# Patient Record
Sex: Male | Born: 1985 | Race: White | Hispanic: No | Marital: Married | Smoking: Former smoker
Health system: Southern US, Community
[De-identification: ages and names within clinical notes are randomized; demographics above are authoritative.]

## PROBLEM LIST (undated history)

## (undated) DIAGNOSIS — E785 Hyperlipidemia, unspecified: Secondary | ICD-10-CM

## (undated) DIAGNOSIS — I10 Essential (primary) hypertension: Secondary | ICD-10-CM

## (undated) DIAGNOSIS — F419 Anxiety disorder, unspecified: Secondary | ICD-10-CM

## (undated) DIAGNOSIS — R519 Headache, unspecified: Secondary | ICD-10-CM

## (undated) DIAGNOSIS — K219 Gastro-esophageal reflux disease without esophagitis: Secondary | ICD-10-CM

## (undated) DIAGNOSIS — F32A Depression, unspecified: Secondary | ICD-10-CM

## (undated) DIAGNOSIS — G4730 Sleep apnea, unspecified: Secondary | ICD-10-CM

## (undated) HISTORY — PX: WISDOM TOOTH EXTRACTION: SHX21

---

## 2009-05-17 ENCOUNTER — Emergency Department: Payer: Self-pay | Admitting: Emergency Medicine

## 2009-05-20 ENCOUNTER — Emergency Department: Payer: Self-pay | Admitting: Emergency Medicine

## 2009-05-24 ENCOUNTER — Emergency Department: Payer: Self-pay | Admitting: Emergency Medicine

## 2009-05-31 ENCOUNTER — Emergency Department: Payer: Self-pay | Admitting: Emergency Medicine

## 2009-06-14 ENCOUNTER — Emergency Department: Payer: Self-pay | Admitting: Emergency Medicine

## 2014-03-21 ENCOUNTER — Emergency Department: Payer: Self-pay | Admitting: Emergency Medicine

## 2014-03-21 LAB — COMPREHENSIVE METABOLIC PANEL
ALBUMIN: 4.3 g/dL (ref 3.4–5.0)
Alkaline Phosphatase: 75 U/L
Anion Gap: 5 — ABNORMAL LOW (ref 7–16)
BUN: 11 mg/dL (ref 7–18)
Bilirubin,Total: 0.4 mg/dL (ref 0.2–1.0)
Calcium, Total: 9 mg/dL (ref 8.5–10.1)
Chloride: 106 mmol/L (ref 98–107)
Co2: 28 mmol/L (ref 21–32)
Creatinine: 0.99 mg/dL (ref 0.60–1.30)
Glucose: 99 mg/dL (ref 65–99)
OSMOLALITY: 277 (ref 275–301)
POTASSIUM: 3.8 mmol/L (ref 3.5–5.1)
SGOT(AST): 29 U/L (ref 15–37)
SGPT (ALT): 56 U/L (ref 12–78)
SODIUM: 139 mmol/L (ref 136–145)
TOTAL PROTEIN: 8.2 g/dL (ref 6.4–8.2)

## 2014-03-21 LAB — CBC WITH DIFFERENTIAL/PLATELET
BASOS PCT: 0.5 %
Basophil #: 0 10*3/uL (ref 0.0–0.1)
Eosinophil #: 0.1 10*3/uL (ref 0.0–0.7)
Eosinophil %: 1.4 %
HCT: 47.7 % (ref 40.0–52.0)
HGB: 15.9 g/dL (ref 13.0–18.0)
LYMPHS PCT: 36.9 %
Lymphocyte #: 3 10*3/uL (ref 1.0–3.6)
MCH: 30.8 pg (ref 26.0–34.0)
MCHC: 33.4 g/dL (ref 32.0–36.0)
MCV: 92 fL (ref 80–100)
MONOS PCT: 7.1 %
Monocyte #: 0.6 x10 3/mm (ref 0.2–1.0)
NEUTROS PCT: 54.1 %
Neutrophil #: 4.4 10*3/uL (ref 1.4–6.5)
PLATELETS: 212 10*3/uL (ref 150–440)
RBC: 5.16 10*6/uL (ref 4.40–5.90)
RDW: 12.3 % (ref 11.5–14.5)
WBC: 8.1 10*3/uL (ref 3.8–10.6)

## 2015-08-23 ENCOUNTER — Other Ambulatory Visit: Payer: Self-pay | Admitting: Internal Medicine

## 2015-08-23 DIAGNOSIS — M5442 Lumbago with sciatica, left side: Secondary | ICD-10-CM

## 2015-09-01 ENCOUNTER — Other Ambulatory Visit: Payer: Self-pay | Admitting: Internal Medicine

## 2015-09-01 ENCOUNTER — Ambulatory Visit
Admission: RE | Admit: 2015-09-01 | Discharge: 2015-09-01 | Disposition: A | Discharge status: Home / Self Care | Payer: 59 | Source: Ambulatory Visit | Attending: Internal Medicine | Admitting: Internal Medicine

## 2015-09-01 DIAGNOSIS — M5136 Other intervertebral disc degeneration, lumbar region: Secondary | ICD-10-CM | POA: Diagnosis not present

## 2015-09-01 DIAGNOSIS — Z9889 Other specified postprocedural states: Secondary | ICD-10-CM

## 2015-09-01 DIAGNOSIS — M5442 Lumbago with sciatica, left side: Secondary | ICD-10-CM | POA: Insufficient documentation

## 2015-09-01 DIAGNOSIS — Z1389 Encounter for screening for other disorder: Secondary | ICD-10-CM | POA: Diagnosis present

## 2015-09-01 DIAGNOSIS — M5127 Other intervertebral disc displacement, lumbosacral region: Secondary | ICD-10-CM | POA: Insufficient documentation

## 2015-09-03 ENCOUNTER — Ambulatory Visit: Payer: Self-pay

## 2016-05-05 ENCOUNTER — Ambulatory Visit
Admission: RE | Admit: 2016-05-05 | Discharge: 2016-05-05 | Disposition: A | Discharge status: Home / Self Care | Payer: 59 | Source: Ambulatory Visit | Attending: Internal Medicine | Admitting: Internal Medicine

## 2016-05-05 ENCOUNTER — Other Ambulatory Visit: Payer: Self-pay | Admitting: Internal Medicine

## 2016-05-05 DIAGNOSIS — M545 Low back pain: Secondary | ICD-10-CM | POA: Diagnosis not present

## 2016-05-05 DIAGNOSIS — M25562 Pain in left knee: Secondary | ICD-10-CM | POA: Diagnosis not present

## 2016-05-25 DIAGNOSIS — M25562 Pain in left knee: Secondary | ICD-10-CM | POA: Diagnosis not present

## 2016-05-25 DIAGNOSIS — M7052 Other bursitis of knee, left knee: Secondary | ICD-10-CM | POA: Diagnosis not present

## 2016-05-30 ENCOUNTER — Other Ambulatory Visit: Payer: Self-pay | Admitting: Orthopedic Surgery

## 2016-05-30 DIAGNOSIS — M25562 Pain in left knee: Secondary | ICD-10-CM

## 2016-06-12 ENCOUNTER — Ambulatory Visit
Admission: RE | Admit: 2016-06-12 | Discharge: 2016-06-12 | Disposition: A | Discharge status: Home / Self Care | Payer: 59 | Source: Ambulatory Visit | Attending: Orthopedic Surgery | Admitting: Orthopedic Surgery

## 2016-06-12 DIAGNOSIS — M2242 Chondromalacia patellae, left knee: Secondary | ICD-10-CM | POA: Insufficient documentation

## 2016-06-12 DIAGNOSIS — M13862 Other specified arthritis, left knee: Secondary | ICD-10-CM | POA: Insufficient documentation

## 2016-06-12 DIAGNOSIS — M25562 Pain in left knee: Secondary | ICD-10-CM | POA: Diagnosis not present

## 2016-10-22 DIAGNOSIS — Z20818 Contact with and (suspected) exposure to other bacterial communicable diseases: Secondary | ICD-10-CM | POA: Diagnosis not present

## 2016-10-22 DIAGNOSIS — J039 Acute tonsillitis, unspecified: Secondary | ICD-10-CM | POA: Diagnosis not present

## 2016-10-26 ENCOUNTER — Emergency Department
Admission: EM | Admit: 2016-10-26 | Discharge: 2016-10-26 | Disposition: A | Discharge status: Home / Self Care | Payer: 59 | Attending: Emergency Medicine | Admitting: Emergency Medicine

## 2016-10-26 ENCOUNTER — Encounter: Payer: Self-pay | Admitting: *Deleted

## 2016-10-26 DIAGNOSIS — Z23 Encounter for immunization: Secondary | ICD-10-CM | POA: Diagnosis not present

## 2016-10-26 DIAGNOSIS — W268XXA Contact with other sharp object(s), not elsewhere classified, initial encounter: Secondary | ICD-10-CM | POA: Insufficient documentation

## 2016-10-26 DIAGNOSIS — F17200 Nicotine dependence, unspecified, uncomplicated: Secondary | ICD-10-CM | POA: Diagnosis not present

## 2016-10-26 DIAGNOSIS — Y929 Unspecified place or not applicable: Secondary | ICD-10-CM | POA: Diagnosis not present

## 2016-10-26 DIAGNOSIS — Y939 Activity, unspecified: Secondary | ICD-10-CM | POA: Insufficient documentation

## 2016-10-26 DIAGNOSIS — Y999 Unspecified external cause status: Secondary | ICD-10-CM | POA: Insufficient documentation

## 2016-10-26 DIAGNOSIS — S99921A Unspecified injury of right foot, initial encounter: Secondary | ICD-10-CM | POA: Diagnosis present

## 2016-10-26 DIAGNOSIS — S91311A Laceration without foreign body, right foot, initial encounter: Secondary | ICD-10-CM | POA: Insufficient documentation

## 2016-10-26 MED ORDER — SULFAMETHOXAZOLE-TRIMETHOPRIM 800-160 MG PO TABS: 1.0000 | ORAL_TABLET | Freq: Two times a day (BID) | ORAL | 0 refills | Status: DC

## 2016-10-26 MED ORDER — OXYCODONE-ACETAMINOPHEN 7.5-325 MG PO TABS: 1.0000 | ORAL_TABLET | ORAL | 0 refills | Status: DC | PRN

## 2016-10-26 MED ORDER — TETANUS-DIPHTH-ACELL PERTUSSIS 5-2.5-18.5 LF-MCG/0.5 IM SUSP
0.5000 mL | Freq: Once | INTRAMUSCULAR | Status: AC
  Administered 2016-10-26: 0.5 mL via INTRAMUSCULAR

## 2016-10-26 MED ORDER — TETANUS-DIPHTH-ACELL PERTUSSIS 5-2.5-18.5 LF-MCG/0.5 IM SUSP
INTRAMUSCULAR | Status: AC
  Administered 2016-10-26: 0.5 mL via INTRAMUSCULAR
  Filled 2016-10-26: qty 0.5

## 2016-10-26 NOTE — ED Triage Notes (Signed)
Pt stepped on a razor blade and sustained a laceration on the bottom of his right foot

## 2016-10-26 NOTE — ED Provider Notes (Signed)
[facility] Emergency Department Provider Note   ____________________________________________   None    (approximate)  I have reviewed the triage vital signs and the nursing notes.   HISTORY  Chief Complaint Extremity Laceration    HPI Marguerite OleaJimmy L Kauzlarich is a 30 y.o. male patient complain of pain and bleeding plantar aspect the right foot secondary to stepping on a razor blade.Patient state unable to stop the bleeding with pressure. Patient denies loss of sensation or loss of function of the foot. Incident occurred prior to arrival. Patient rates his pain as a 5/10. No other palliative measures for this complaint. Patient tetanus shot is not up-to-date.   History reviewed. No pertinent past medical history.  There are no active problems to display for this patient.   History reviewed. No pertinent surgical history.  Prior to Admission medications   Medication Sig Start Date End Date Taking? Authorizing Provider  oxyCODONE-acetaminophen (PERCOCET) 7.5-325 MG tablet Take 1 tablet by mouth every 4 (four) hours as needed for severe pain. 10/26/16   Joni Reiningonald K Kelise Kuch, PA-C  sulfamethoxazole-trimethoprim (BACTRIM DS,SEPTRA DS) 800-160 MG tablet Take 1 tablet by mouth 2 (two) times daily. 10/26/16   Joni Reiningonald K Barbara Keng, PA-C    Allergies Patient has no known allergies.  No family history on file.  Social History Social History  Substance Use Topics  . Smoking status: Current Every Day Smoker  . Smokeless tobacco: Current User  . Alcohol use Yes    Review of Systems Constitutional: No fever/chills Eyes: No visual changes. ENT: No sore throat. Cardiovascular: Denies chest pain. Respiratory: Denies shortness of breath. Gastrointestinal: No abdominal pain.  No nausea, no vomiting.  No diarrhea.  No constipation. Genitourinary: Negative for dysuria. Musculoskeletal: Negative for back pain. Skin: Negative for rash. Laceration palmar aspect the right  foot. Neurological: Negative for headaches, focal weakness or numbness.    ____________________________________________   PHYSICAL EXAM:  VITAL SIGNS: ED Triage Vitals  Enc Vitals Group     BP 10/26/16 0719 133/87     Pulse Rate 10/26/16 0719 88     Resp 10/26/16 0719 18     Temp 10/26/16 0719 97.6 F (36.4 C)     Temp Source 10/26/16 0719 Oral     SpO2 10/26/16 0719 97 %     Weight 10/26/16 0716 208 lb (94.3 kg)     Height 10/26/16 0716 5\' 10"  (1.778 m)     Head Circumference --      Peak Flow --      Pain Score 10/26/16 0716 5     Pain Loc --      Pain Edu? --      Excl. in [place]? --     Constitutional: Alert and oriented. Well appearing and in no acute distress. Eyes: Conjunctivae are normal. PERRL. EOMI. Head: Atraumatic. Nose: No congestion/rhinnorhea. Mouth/Throat: Mucous membranes are moist.  Oropharynx non-erythematous. Neck: No stridor.  No cervical spine tenderness to palpation. Hematological/Lymphatic/Immunilogical: No cervical lymphadenopathy. Cardiovascular: Normal rate, regular rhythm. Grossly normal heart sounds.  Good peripheral circulation. Respiratory: Normal respiratory effort.  No retractions. Lungs CTAB. Gastrointestinal: Soft and nontender. No distention. No abdominal bruits. No CVA tenderness. Musculoskeletal: No lower extremity tenderness nor edema.  No joint effusions. Neurologic:  Normal speech and language. No gross focal neurologic deficits are appreciated. No gait instability. Skin: 2 cm laceration plantar aspect the right foot.  Psychiatric: Mood and affect are normal. Speech and behavior are normal.  ____________________________________________   LABS (all  labs ordered are listed, but only abnormal results are displayed)  Labs Reviewed - No data to  display ____________________________________________  EKG   ____________________________________________  RADIOLOGY   ____________________________________________   PROCEDURES  Procedure(s) performed: LACERATION REPAIR Performed by: Joni Reiningonald K Tymber Stallings Authorized by: Joni Reiningonald K Taunja Brickner Consent: Verbal consent obtained. Risks and benefits: risks, benefits and alternatives were discussed Consent given by: patient Patient identity confirmed: provided demographic data Prepped and Draped in normal sterile fashion Wound explored  Laceration Location: Plantar aspect right foot  Laceration Length: 2cm  No Foreign Bodies seen or palpated  Anesthesia: local infiltration  Local anesthetic: lidocaine 1% with epinephrine  Anesthetic total: 5 ml  Irrigation method: syringe Amount of cleaning: standard  Skin closure: 3-0 nylon Number of sutures: 8   Technique: Interrupted Patient tolerance: Patient tolerated the procedure well with no immediate complications.   Procedures  Critical Care performed: No  ____________________________________________   INITIAL IMPRESSION / ASSESSMENT AND PLAN / ED COURSE  Pertinent labs & imaging results that were available during my care of the patient were reviewed by me and considered in my medical decision making (see chart for details).  Laceration plantar aspect right foot. Patient given discharge care instructions. Patient advised return back 10 days for suture removal only had suture removal by family care doctor. Patient given prescription for Bactrim DS and Percocets.  Clinical Course    Patient given tetanus shot prior to departure.  ____________________________________________   FINAL CLINICAL IMPRESSION(S) / ED DIAGNOSES  Final diagnoses:  Laceration of right foot, initial encounter      NEW MEDICATIONS STARTED DURING THIS VISIT:  New Prescriptions   OXYCODONE-ACETAMINOPHEN (PERCOCET) 7.5-325 MG TABLET    Take 1 tablet  by mouth every 4 (four) hours as needed for severe pain.   SULFAMETHOXAZOLE-TRIMETHOPRIM (BACTRIM DS,SEPTRA DS) 800-160 MG TABLET    Take 1 tablet by mouth 2 (two) times daily.     Note:  This document was prepared using Dragon voice recognition software and may include unintentional dictation errors.    Joni Reiningonald K Andreya Lacks, PA-C 10/26/16 [id number]    Jeanmarie PlantJames A McShane, MD 10/28/16 [phone]

## 2016-10-26 NOTE — ED Notes (Signed)
See triage note   States he stepped on a razor blade  Laceration noted to right foot/heel

## 2016-10-26 NOTE — Discharge Instructions (Signed)
Ambulate with crutches for 2-3 days. °

## 2016-11-15 DIAGNOSIS — G43709 Chronic migraine without aura, not intractable, without status migrainosus: Secondary | ICD-10-CM | POA: Diagnosis not present

## 2016-11-15 DIAGNOSIS — I10 Essential (primary) hypertension: Secondary | ICD-10-CM | POA: Diagnosis not present

## 2016-11-15 DIAGNOSIS — F418 Other specified anxiety disorders: Secondary | ICD-10-CM | POA: Diagnosis not present

## 2016-12-22 DIAGNOSIS — F418 Other specified anxiety disorders: Secondary | ICD-10-CM | POA: Diagnosis not present

## 2016-12-22 DIAGNOSIS — G43709 Chronic migraine without aura, not intractable, without status migrainosus: Secondary | ICD-10-CM | POA: Diagnosis not present

## 2016-12-22 DIAGNOSIS — I10 Essential (primary) hypertension: Secondary | ICD-10-CM | POA: Diagnosis not present

## 2017-03-16 DIAGNOSIS — F419 Anxiety disorder, unspecified: Secondary | ICD-10-CM | POA: Diagnosis not present

## 2017-03-16 DIAGNOSIS — Z13220 Encounter for screening for lipoid disorders: Secondary | ICD-10-CM | POA: Diagnosis not present

## 2017-03-16 DIAGNOSIS — Z79899 Other long term (current) drug therapy: Secondary | ICD-10-CM | POA: Diagnosis not present

## 2017-03-16 DIAGNOSIS — Z1329 Encounter for screening for other suspected endocrine disorder: Secondary | ICD-10-CM | POA: Diagnosis not present

## 2017-03-16 DIAGNOSIS — I10 Essential (primary) hypertension: Secondary | ICD-10-CM | POA: Diagnosis not present

## 2017-03-16 DIAGNOSIS — G43709 Chronic migraine without aura, not intractable, without status migrainosus: Secondary | ICD-10-CM | POA: Diagnosis not present

## 2017-04-10 ENCOUNTER — Emergency Department
Admission: EM | Admit: 2017-04-10 | Discharge: 2017-04-10 | Disposition: A | Discharge status: Home / Self Care | Payer: 59 | Attending: Emergency Medicine | Admitting: Emergency Medicine

## 2017-04-10 DIAGNOSIS — F17200 Nicotine dependence, unspecified, uncomplicated: Secondary | ICD-10-CM | POA: Insufficient documentation

## 2017-04-10 DIAGNOSIS — Z79899 Other long term (current) drug therapy: Secondary | ICD-10-CM | POA: Insufficient documentation

## 2017-04-10 DIAGNOSIS — S00531A Contusion of lip, initial encounter: Secondary | ICD-10-CM

## 2017-04-10 DIAGNOSIS — Y999 Unspecified external cause status: Secondary | ICD-10-CM | POA: Insufficient documentation

## 2017-04-10 DIAGNOSIS — Y9389 Activity, other specified: Secondary | ICD-10-CM | POA: Diagnosis not present

## 2017-04-10 DIAGNOSIS — W228XXA Striking against or struck by other objects, initial encounter: Secondary | ICD-10-CM | POA: Insufficient documentation

## 2017-04-10 DIAGNOSIS — Y929 Unspecified place or not applicable: Secondary | ICD-10-CM | POA: Insufficient documentation

## 2017-04-10 DIAGNOSIS — S01511A Laceration without foreign body of lip, initial encounter: Secondary | ICD-10-CM | POA: Diagnosis not present

## 2017-04-10 DIAGNOSIS — K0381 Cracked tooth: Secondary | ICD-10-CM | POA: Insufficient documentation

## 2017-04-10 MED ORDER — LIDOCAINE-EPINEPHRINE 2 %-1:100000 IJ SOLN
INTRAMUSCULAR | Status: AC
  Filled 2017-04-10: qty 1.7

## 2017-04-10 MED ORDER — LIDOCAINE-EPINEPHRINE-TETRACAINE (LET) SOLUTION
3.0000 mL | Freq: Once | NASAL | Status: AC
  Administered 2017-04-10: 3 mL via TOPICAL

## 2017-04-10 MED ORDER — IBUPROFEN 600 MG PO TABS: 600.0000 mg | ORAL_TABLET | Freq: Four times a day (QID) | ORAL | 0 refills | Status: DC | PRN

## 2017-04-10 MED ORDER — HYDROCODONE-ACETAMINOPHEN 5-325 MG PO TABS: 1.0000 | ORAL_TABLET | Freq: Four times a day (QID) | ORAL | 0 refills | Status: DC | PRN

## 2017-04-10 MED ORDER — LIDOCAINE-EPINEPHRINE 2 %-1:100000 IJ SOLN: 1.7000 mL | Freq: Once | INTRAMUSCULAR | Status: DC

## 2017-04-10 MED ORDER — AMOXICILLIN 875 MG PO TABS: 875.0000 mg | ORAL_TABLET | Freq: Two times a day (BID) | ORAL | 0 refills | Status: DC

## 2017-04-10 MED ORDER — LIDOCAINE-EPINEPHRINE-TETRACAINE (LET) SOLUTION
NASAL | Status: AC
  Administered 2017-04-10: 3 mL via TOPICAL
  Filled 2017-04-10: qty 3

## 2017-04-10 MED ORDER — AMOXICILLIN 500 MG PO CAPS
500.0000 mg | ORAL_CAPSULE | Freq: Once | ORAL | Status: AC
  Administered 2017-04-10: 500 mg via ORAL
  Filled 2017-04-10: qty 1

## 2017-04-10 NOTE — ED Notes (Signed)
Dental lidocaine given to Curtis Allen, Curtis Allen.

## 2017-04-10 NOTE — ED Triage Notes (Signed)
Pt was working with metal object and it came up and hit him in the mouth. Upper front tooth is cracked and patient states feels loose. Also has lac to lower lip. States felt dizzy right after episode but has not had any dizziness prior to event. Pt other injury or complaints.

## 2017-04-10 NOTE — ED Notes (Signed)
LET applied to lower lip

## 2017-04-10 NOTE — Discharge Instructions (Signed)
Please apply ice to the lip 20 minutes every hour for the next 24 hours. Remove sutures in 5 days. Return to the ER for any redness warmth or increase in swelling. Please follow-up with your dentist in the next 2-3 days.

## 2017-04-10 NOTE — ED Provider Notes (Signed)
[facility]-EMERGENCY DEPARTMENT Provider Note   CSN: [id number] Arrival date & time: 04/10/17  1902     History   Chief Complaint Chief Complaint  Patient presents with  . Lip Laceration    HPI Curtis Allen is a 31 y.o. male presents to the emergency department for evaluation of lower lip laceration. Patient was lifting a metal object just prior to arrival in the object hit him in the lower lip, he bit his lower lip suffered laceration and cracked his top tooth #9. Patient has moderate pain to the upper tooth as well as to the lower lip. No loss of consciousness, patient felt slightly dizzy after the accident for just a minute, no dizziness, lightheadedness, headache, nausea or vomiting at this time. He has not had a medications for pain. Tetanus is up-to-date.  HPI  No past medical history on file.  There are no active problems to display for this patient.   No past surgical history on file.     Home Medications    Prior to Admission medications   Medication Sig Start Date End Date Taking? Authorizing Provider  amoxicillin (AMOXIL) 875 MG tablet Take 1 tablet (875 mg total) by mouth 2 (two) times daily. X 7 days 04/10/17   Evon SlackGaines, Matilynn Dacey C, PA-C  HYDROcodone-acetaminophen (NORCO) 5-325 MG tablet Take 1 tablet by mouth every 6 (six) hours as needed for moderate pain. 04/10/17   Evon SlackGaines, Jari Dipasquale C, PA-C  ibuprofen (ADVIL,MOTRIN) 600 MG tablet Take 1 tablet (600 mg total) by mouth every 6 (six) hours as needed for moderate pain. 04/10/17   Evon SlackGaines, Jermany Sundell C, PA-C  oxyCODONE-acetaminophen (PERCOCET) 7.5-325 MG tablet Take 1 tablet by mouth every 4 (four) hours as needed for severe pain. 10/26/16   Joni ReiningSmith, Ronald K, PA-C  sulfamethoxazole-trimethoprim (BACTRIM DS,SEPTRA DS) 800-160 MG tablet Take 1 tablet by mouth 2 (two) times daily. 10/26/16   Joni ReiningSmith, Ronald K, PA-C    Family History No family history on file.  Social History Social History  Substance Use Topics  . Smoking status:  Current Every Day Smoker  . Smokeless tobacco: Current User  . Alcohol use Yes     Allergies   Patient has no known allergies.   Review of Systems Review of Systems  Constitutional: Negative.  Negative for chills and fever.  HENT: Positive for facial swelling. Negative for ear pain.   Eyes: Negative for photophobia, pain and discharge.  Respiratory: Negative for cough.   Cardiovascular: Negative for chest pain.  Gastrointestinal: Negative for nausea and vomiting.  Musculoskeletal: Negative for arthralgias, back pain and gait problem.  Skin: Positive for wound. Negative for color change and rash.  Neurological: Negative for dizziness, light-headedness, numbness and headaches.  Hematological: Negative for adenopathy.  Psychiatric/Behavioral: Negative for agitation and behavioral problems.     Physical Exam Updated Vital Signs BP 133/84 (BP Location: Right Arm)   Pulse 83   Temp 98.7 F (37.1 C) (Oral)   Resp 16   Ht 5\' 11"  (1.803 m)   Wt 89.8 kg (198 lb)   SpO2 98%   BMI 27.62 kg/m   Physical Exam  Constitutional: He is oriented to person, place, and time. He appears well-developed and well-nourished. No distress.  HENT:  Head: Normocephalic and atraumatic.  Right Ear: External ear normal.  Left Ear: External ear normal.  Mouth/Throat: Oropharynx is clear and moist.  1 cm laceration to the outer lower lip. Small abrasions to the inner aspect of the lower lip, did not appear  to be a through and through laceration. Tooth #9 is partially avulsed but intact and not loose. Tooth is Tender to palpation.  Eyes: Conjunctivae and EOM are normal.  Neck: Normal range of motion.  Cardiovascular: Normal rate.   Pulmonary/Chest: Effort normal. No respiratory distress.  Neurological: He is alert and oriented to person, place, and time.  Skin: Skin is warm.     ED Treatments / Results  Labs (all labs ordered are listed, but only abnormal results are displayed) Labs Reviewed -  No data to display  EKG  EKG Interpretation None       Radiology No results found.  Procedures Procedures (including critical care time) LACERATION REPAIR Performed by: Patience Musca Authorized by: Patience Musca Consent: Verbal consent obtained. Risks and benefits: risks, benefits and alternatives were discussed Consent given by: patient Patient identity confirmed: provided demographic data Prepped and Draped in normal sterile fashion Wound explored  Laceration Location: Lower lip  Laceration Length: 1 cm  No Foreign Bodies seen or palpated  Anesthesia: local infiltration  Local anesthetic: lidocaine topical let   Anesthetic total: 1.0 ml  Irrigation method: syringe Amount of cleaning: standard  Skin closure: Simple interrupted 6-0 nylon   Number of sutures: 3   Technique: Simple interrupted 6-0 nylon   Patient tolerance: Patient tolerated the procedure well with no immediate complications.   Medications Ordered in ED Medications  lidocaine-EPINEPHrine (XYLOCAINE W/EPI) 2 %-1:100000 (with pres) injection 1.7 mL (not administered)  lidocaine-EPINEPHrine-tetracaine (LET) solution (3 mLs Topical Given 04/10/17 1945)  amoxicillin (AMOXIL) capsule 500 mg (500 mg Oral Given 04/10/17 2022)     Initial Impression / Assessment and Plan / ED Course  I have reviewed the triage vital signs and the nursing notes.  Pertinent labs & imaging results that were available during my care of the patient were reviewed by me and considered in my medical decision making (see chart for details).     31 year old male with laceration to the left lower lip with mild contusion. Laceration is thoroughly irrigated, no foreign body visualized or palpated, repaired with #3 6-0 nylon sutures. He is placed on prophylactic antibiotics. He will follow-up with dental clinic for avulsed tooth, sutures will be removed in 5 days.  Final Clinical Impressions(s) / ED  Diagnoses   Final diagnoses:  Cracked tooth  Lip laceration, initial encounter  Contusion of lip, initial encounter    New Prescriptions Discharge Medication List as of 04/10/2017  8:17 PM    START taking these medications   Details  amoxicillin (AMOXIL) 875 MG tablet Take 1 tablet (875 mg total) by mouth 2 (two) times daily. X 7 days, Starting Tue 04/10/2017, Print    HYDROcodone-acetaminophen (NORCO) 5-325 MG tablet Take 1 tablet by mouth every 6 (six) hours as needed for moderate pain., Starting Tue 04/10/2017, Print    ibuprofen (ADVIL,MOTRIN) 600 MG tablet Take 1 tablet (600 mg total) by mouth every 6 (six) hours as needed for moderate pain., Starting Tue 04/10/2017, Print         Evon Slack, PA-C 04/10/17 2036    Arnaldo Natal, MD 04/10/17 [phone]

## 2017-05-23 IMAGING — MR MR KNEE*L* W/O CM
5 series · 35 of 40 positions shown · non-contrast
Comparison: None.

CLINICAL DATA: Left knee pain for 2 months.  No known injury.

EXAM:
MRI OF THE LEFT KNEE WITHOUT CONTRAST
TECHNIQUE: Multiplanar, multisequence MR imaging of the knee was performed. No
intravenous contrast was administered.

[Series 3: PD fat-sat · axial · 3.0mm · 0.50mm/px · z∈[-51,+61]mm · 8 of 35 slices shown (1 of 3)]
[im 1/35]
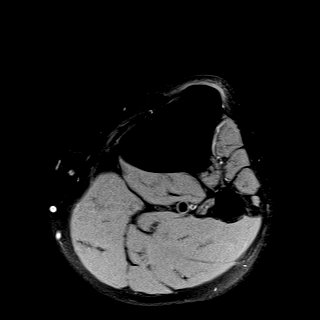
[im 5/35]
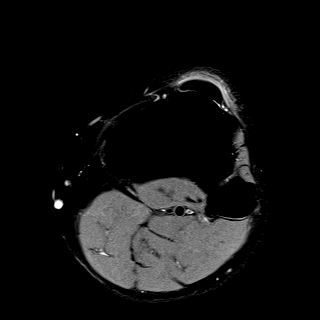
[im 10/35]
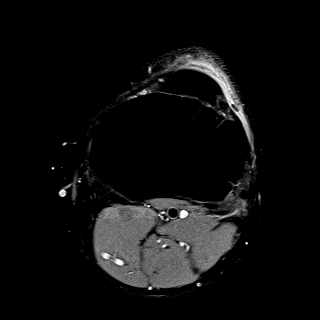
[im 15/35]
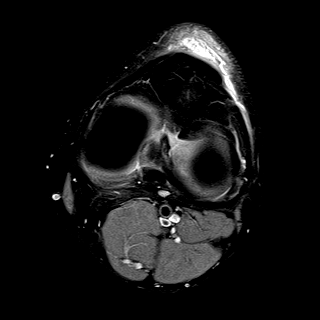
[im 20/35]
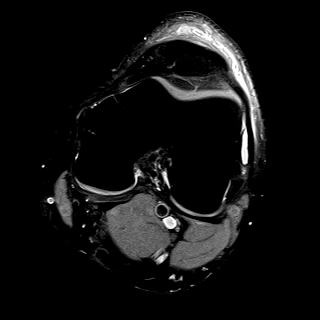
[im 25/35]
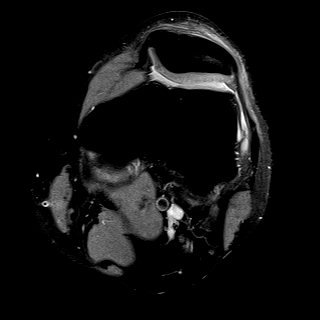
[im 30/35]
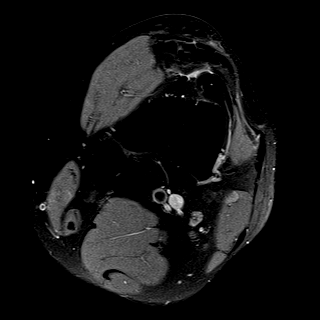
[im 35/35]
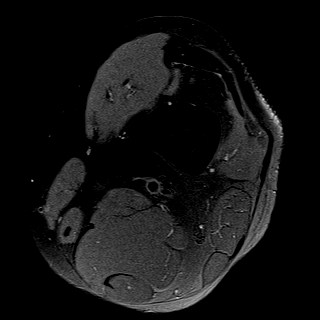

[Series 4: T1 · coronal · 3.0mm · 0.50mm/px · 3 of 29 slices shown]
[im 1/29]
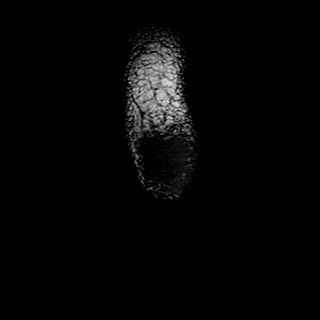
[im 5/29]
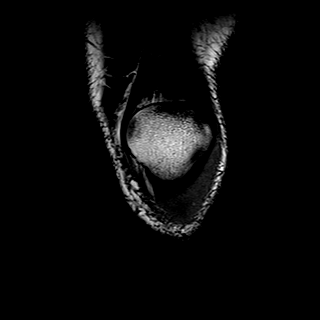
[im 9/29]
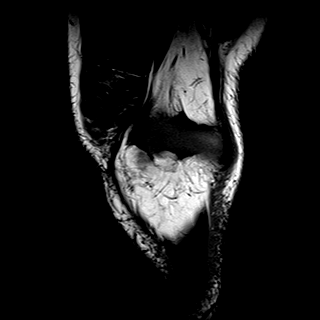

[Series 5: T2 fat-sat · coronal · 3.0mm · 0.31mm/px · 8 of 29 slices shown]
[im 1/29]
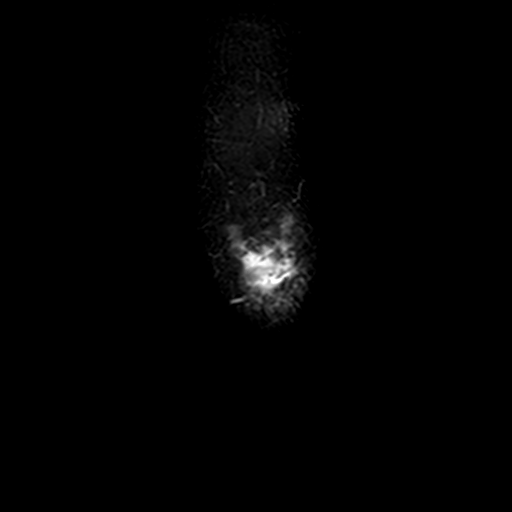
[im 5/29]
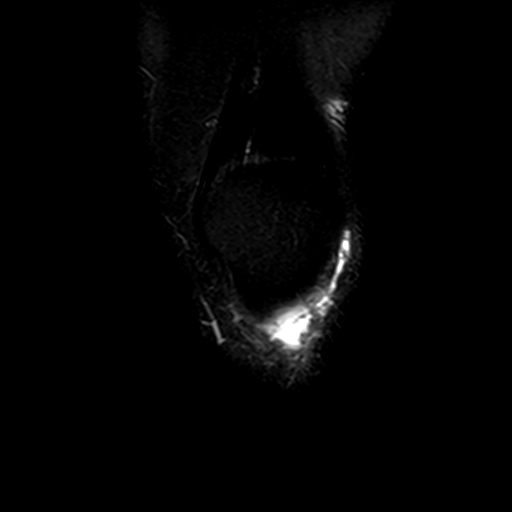
[im 9/29]
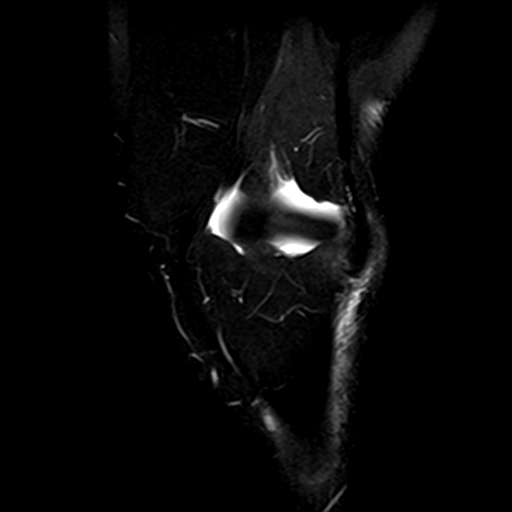
[im 13/29]
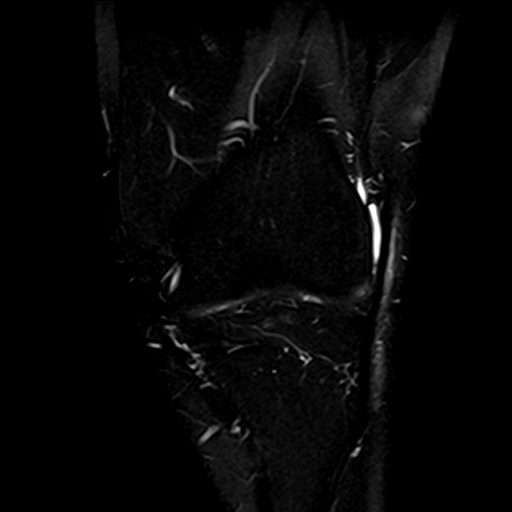
[im 17/29]
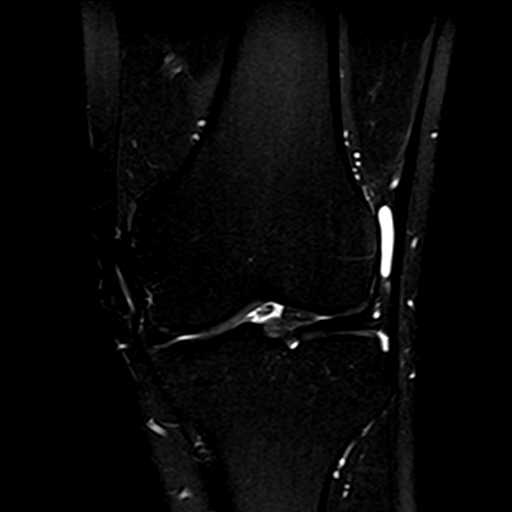
[im 21/29]
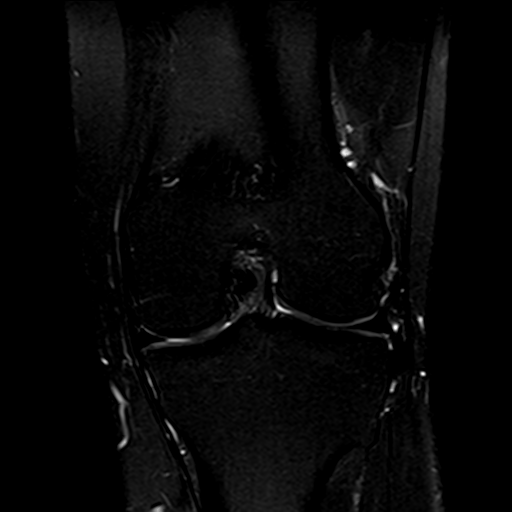
[im 25/29]
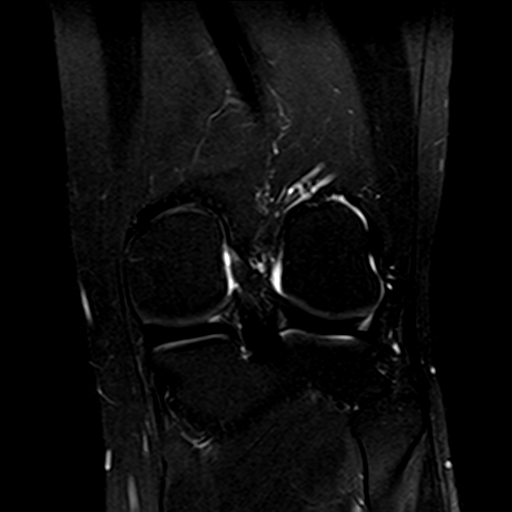
[im 29/29]
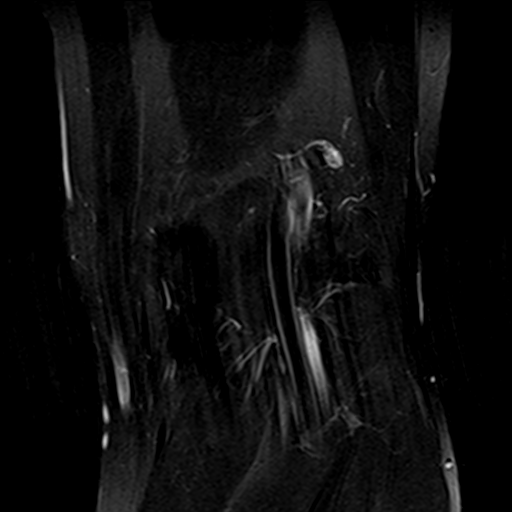

[Series 6: PD fat-sat · coronal · 3.0mm · 0.50mm/px · 8 of 29 slices shown (2 of 3)]
[im 1/29]
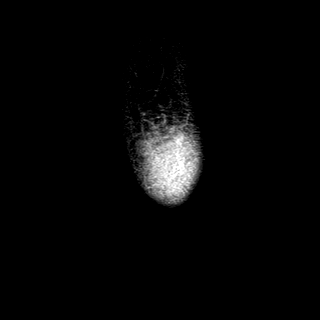
[im 5/29]
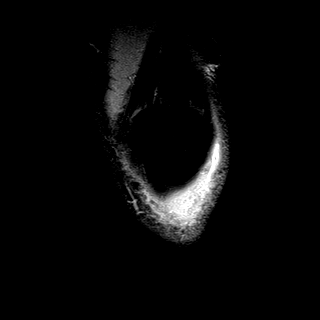
[im 9/29]
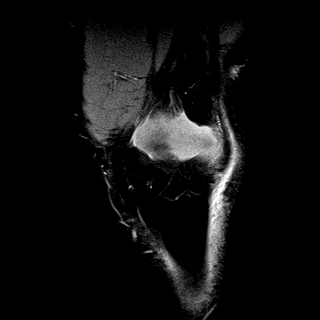
[im 13/29]
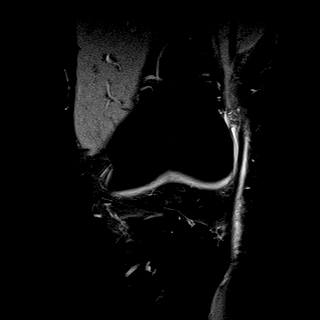
[im 17/29]
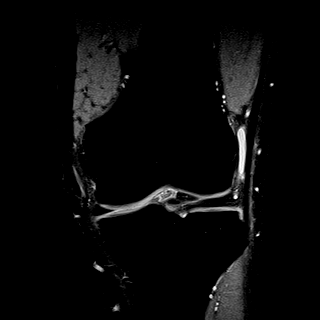
[im 21/29]
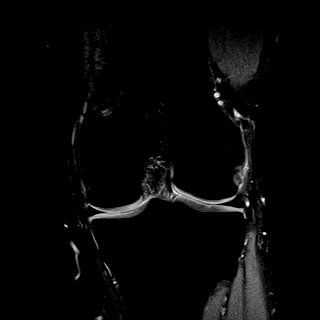
[im 25/29]
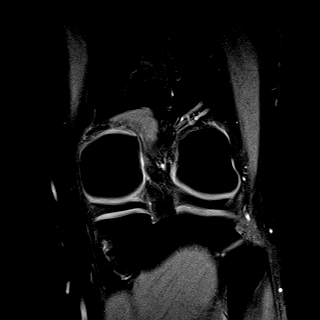
[im 29/29]
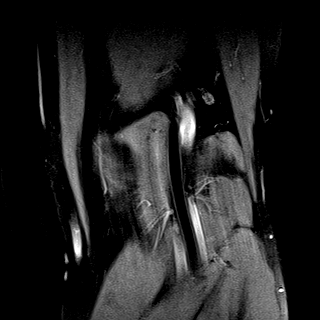

[Series 7: PD fat-sat · sagittal · 3.0mm · 0.50mm/px · 8 of 30 slices shown (3 of 3)]
[im 1/30]
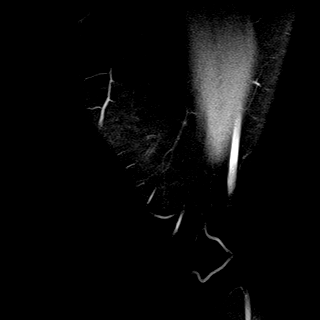
[im 5/30]
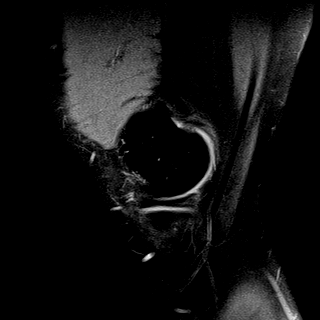
[im 9/30]
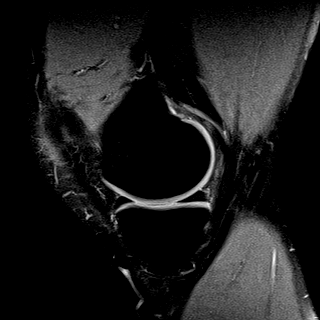
[im 13/30]
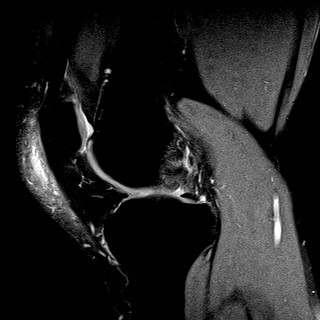
[im 17/30]
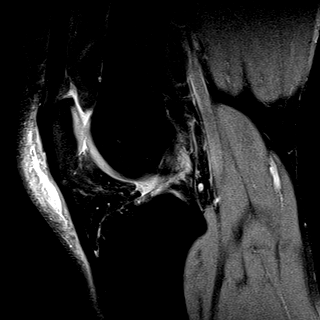
[im 21/30]
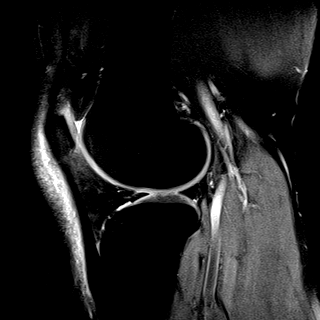
[im 25/30]
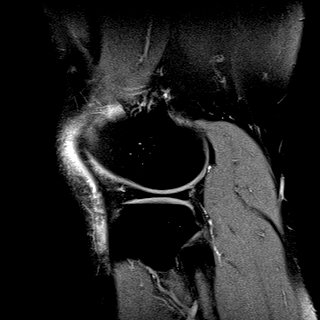
[im 30/30]
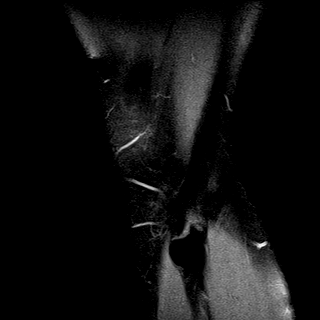

[35 of 40 positions shown; findings below may reference images not displayed]

FINDINGS: MENISCI

Medial meniscus:  Intact.

Lateral meniscus:  Intact.

LIGAMENTS

Cruciates:  Intact.

Collaterals:  Intact.

CARTILAGE

Patellofemoral: Mild degenerative chondrosis/chondromalacia along
the patellar apex and lateral facet. No cartilage defects.

Medial:  Minimal chondromalacia.

Lateral:  Minimal chondromalacia.

Joint:  No joint effusion.

Popliteal Fossa:  No popliteal mass or Baker's cyst.

Extensor Mechanism: The patella retinacular structures are normal
and the quadriceps and patellar tendons are intact.

Bones: No acute bony findings. No bone contusion, marrow edema or
osteochondral abnormality.

Other: Moderate fluid and inflammation involving the anterior aspect
of the knee. This could be due to a contusion or occupational.
Inflamed prepatellar bursitis is also possible.
IMPRESSION: 1. Intact ligamentous structures and no acute bony findings.
2. No meniscal tears.
3. Minimal changes of chondromalacia.
4. Prepatellar fluid and inflammation could be due to direct trauma,
inflammatory prepatellar bursitis or an occupational process.

## 2019-10-24 ENCOUNTER — Other Ambulatory Visit: Payer: Self-pay

## 2019-10-24 DIAGNOSIS — Z20822 Contact with and (suspected) exposure to covid-19: Secondary | ICD-10-CM

## 2019-10-25 LAB — NOVEL CORONAVIRUS, NAA: SARS-CoV-2, NAA: NOT DETECTED

## 2019-11-04 ENCOUNTER — Other Ambulatory Visit: Payer: Self-pay

## 2022-07-24 ENCOUNTER — Other Ambulatory Visit: Payer: Self-pay | Admitting: Internal Medicine

## 2022-07-24 DIAGNOSIS — R1011 Right upper quadrant pain: Secondary | ICD-10-CM

## 2022-07-28 ENCOUNTER — Ambulatory Visit
Admission: RE | Admit: 2022-07-28 | Discharge: 2022-07-28 | Disposition: A | Discharge status: Home / Self Care | Payer: Self-pay | Source: Ambulatory Visit | Attending: Internal Medicine | Admitting: Internal Medicine

## 2022-07-28 DIAGNOSIS — R1011 Right upper quadrant pain: Secondary | ICD-10-CM | POA: Insufficient documentation

## 2023-01-26 ENCOUNTER — Ambulatory Visit
Admission: RE | Admit: 2023-01-26 | Discharge: 2023-01-26 | Disposition: A | Discharge status: Home / Self Care | Payer: Self-pay | Source: Ambulatory Visit

## 2023-01-26 ENCOUNTER — Ambulatory Visit (INDEPENDENT_AMBULATORY_CARE_PROVIDER_SITE_OTHER): Payer: Self-pay

## 2023-01-26 VITALS — BP 122/80 | HR 63 | Temp 98.2°F | Resp 20

## 2023-01-26 DIAGNOSIS — R059 Cough, unspecified: Secondary | ICD-10-CM

## 2023-01-26 DIAGNOSIS — J209 Acute bronchitis, unspecified: Secondary | ICD-10-CM

## 2023-01-26 DIAGNOSIS — R0602 Shortness of breath: Secondary | ICD-10-CM

## 2023-01-26 DIAGNOSIS — R0989 Other specified symptoms and signs involving the circulatory and respiratory systems: Secondary | ICD-10-CM

## 2023-01-26 HISTORY — DX: Anxiety disorder, unspecified: F41.9

## 2023-01-26 HISTORY — DX: Essential (primary) hypertension: I10

## 2023-01-26 MED ORDER — PREDNISONE 20 MG PO TABS: 40.0000 mg | ORAL_TABLET | Freq: Every day | ORAL | 0 refills | Status: AC

## 2023-01-26 MED ORDER — PROMETHAZINE-DM 6.25-15 MG/5ML PO SYRP: 5.0000 mL | ORAL_SOLUTION | Freq: Four times a day (QID) | ORAL | 0 refills | Status: AC | PRN

## 2023-01-26 MED ORDER — ALBUTEROL SULFATE HFA 108 (90 BASE) MCG/ACT IN AERS: 2.0000 | INHALATION_SPRAY | RESPIRATORY_TRACT | 0 refills | Status: AC | PRN

## 2023-01-26 NOTE — ED Triage Notes (Signed)
Pt reports cough, wheezing, chest discomfort when coughing and taking deep breath since this morning. Dayquil and ibuprofen gives some relief.   Reports he had bronchitis last week.

## 2023-01-30 NOTE — ED Provider Notes (Signed)
[facility]    CSN: [id number] Arrival date & time: 01/26/23  1335      History   Chief Complaint Chief Complaint  Patient presents with   Wheezing    Chest hurting badly when breath coughing up mucus - Entered by patient   Appointment    1400    HPI Curtis Allen is a 37 y.o. male.   Patient presenting today with 1 day history of cough, wheezing, chest tightness with coughing, fatigue.  Denies fever, chills, sore throat, chest pain, abdominal pain, nausea vomiting and diarrhea.  DayQuil and ibuprofen providing some mild temporary benefit.  States he was diagnosed with bronchitis last week, feels like he overall fully resolved from this episode.  No known history of chronic pulmonary disease that he is aware of.  No new sick contacts recently.    Past Medical History:  Diagnosis Date   Anxiety    Hypertension     There are no problems to display for this patient.   History reviewed. No pertinent surgical history.     Home Medications    Prior to Admission medications   Medication Sig Start Date End Date Taking? Authorizing Provider  albuterol (VENTOLIN HFA) 108 (90 Base) MCG/ACT inhaler Inhale 2 puffs into the lungs every 4 (four) hours as needed for wheezing or shortness of breath. 01/26/23  Yes Volney American, PA-C  predniSONE (DELTASONE) 20 MG tablet Take 2 tablets (40 mg total) by mouth daily with breakfast. 01/26/23  Yes Volney American, PA-C  promethazine-dextromethorphan (PROMETHAZINE-DM) 6.25-15 MG/5ML syrup Take 5 mLs by mouth 4 (four) times daily as needed. 01/26/23  Yes Volney American, PA-C  amoxicillin (AMOXIL) 875 MG tablet Take 1 tablet (875 mg total) by mouth 2 (two) times daily. X 7 days 04/10/17   Duanne Guess, PA-C  busPIRone (BUSPAR) 10 MG tablet Take 10 mg by mouth 3 (three) times daily.    [provider]  famotidine (PEPCID) 40 MG tablet Take by mouth.    [provider]   HYDROcodone-acetaminophen (NORCO) 5-325 MG tablet Take 1 tablet by mouth every 6 (six) hours as needed for moderate pain. 04/10/17   Duanne Guess, PA-C  ibuprofen (ADVIL,MOTRIN) 600 MG tablet Take 1 tablet (600 mg total) by mouth every 6 (six) hours as needed for moderate pain. 04/10/17   Duanne Guess, PA-C  losartan (COZAAR) 100 MG tablet Take 100 mg by mouth daily.    [provider]  omeprazole (PRILOSEC) 10 MG capsule Take by mouth.    [provider]  oxyCODONE-acetaminophen (PERCOCET) 7.5-325 MG tablet Take 1 tablet by mouth every 4 (four) hours as needed for severe pain. 10/26/16   Sable Feil, PA-C  propranolol ER (INDERAL LA) 60 MG 24 hr capsule Take 60 mg by mouth daily.    [provider]  sulfamethoxazole-trimethoprim (BACTRIM DS,SEPTRA DS) 800-160 MG tablet Take 1 tablet by mouth 2 (two) times daily. 10/26/16   Sable Feil, PA-C  topiramate (TOPAMAX) 50 MG tablet Take 50 mg by mouth at bedtime.    [provider]    Family History History reviewed. No pertinent family history.  Social History Social History   Tobacco Use   Smoking status: Former    Types: Cigarettes   Smokeless tobacco: Current  Vaping Use   Vaping Use: Every day  Substance Use Topics   Alcohol use: Yes    Comment: Occa   Drug use: Never  Allergies   Patient has no known allergies.   Review of Systems Review of Systems PER HPI  Physical Exam Triage Vital Signs ED Triage Vitals  Enc Vitals Group     BP 01/26/23 1446 122/80     Pulse Rate 01/26/23 1446 63     Resp 01/26/23 1446 20     Temp 01/26/23 1446 98.2 F (36.8 C)     Temp Source 01/26/23 1446 Oral     SpO2 01/26/23 1446 98 %     Weight --      Height --      Head Circumference --      Peak Flow --      Pain Score 01/26/23 1443 5     Pain Loc --      Pain Edu? --      Excl. in [place]? --    No data found.  Updated Vital Signs BP 122/80 (BP Location: Right Arm)   Pulse 63    Temp 98.2 F (36.8 C) (Oral)   Resp 20   SpO2 98%   Visual Acuity Right Eye Distance:   Left Eye Distance:   Bilateral Distance:    Right Eye Near:   Left Eye Near:    Bilateral Near:     Physical Exam Vitals and nursing note reviewed.  Constitutional:      Appearance: He is well-developed.  HENT:     Head: Atraumatic.     Right Ear: External ear normal.     Left Ear: External ear normal.     Nose: Rhinorrhea present.     Mouth/Throat:     Mouth: Mucous membranes are moist.     Pharynx: Oropharynx is clear. No oropharyngeal exudate or posterior oropharyngeal erythema.  Eyes:     Conjunctiva/sclera: Conjunctivae normal.     Pupils: Pupils are equal, round, and reactive to light.  Cardiovascular:     Rate and Rhythm: Normal rate and regular rhythm.  Pulmonary:     Effort: Pulmonary effort is normal. No respiratory distress.     Breath sounds: Wheezing present. No rales.     Comments: B/l wheezes  Musculoskeletal:        General: Normal range of motion.     Cervical back: Normal range of motion and neck supple.  Lymphadenopathy:     Cervical: No cervical adenopathy.  Skin:    General: Skin is warm and dry.  Neurological:     Mental Status: He is alert and oriented to person, place, and time.  Psychiatric:        Behavior: Behavior normal.      UC Treatments / Results  Labs (all labs ordered are listed, but only abnormal results are displayed) Labs Reviewed - No data to display  EKG   Radiology No results found.  Procedures Procedures (including critical care time)  Medications Ordered in UC Medications - No data to display  Initial Impression / Assessment and Plan / UC Course  I have reviewed the triage vital signs and the nursing notes.  Pertinent labs & imaging results that were available during my care of the patient were reviewed by me and considered in my medical decision making (see chart for details).     Chest x-ray performed given  extent of wheezing and recent bout of bronchitis, this was negative for acute pulmonary abnormalities including pneumonia.  Will treat for bronchitis at this time with prednisone, Phenergan DM, albuterol inhaler.  Discussed supportive over-the-counter medications and home  care.  Return for worsening symptoms.  Final Clinical Impressions(s) / UC Diagnoses   Final diagnoses:  Acute bronchitis, unspecified organism   Discharge Instructions   None    ED Prescriptions     Medication Sig Dispense Auth. Provider   predniSONE (DELTASONE) 20 MG tablet Take 2 tablets (40 mg total) by mouth daily with breakfast. 10 tablet Volney American, PA-C   promethazine-dextromethorphan (PROMETHAZINE-DM) 6.25-15 MG/5ML syrup Take 5 mLs by mouth 4 (four) times daily as needed. 100 mL Volney American, PA-C   albuterol (VENTOLIN HFA) 108 (90 Base) MCG/ACT inhaler Inhale 2 puffs into the lungs every 4 (four) hours as needed for wheezing or shortness of breath. 18 g Volney American, [place]      PDMP not reviewed this encounter.   Volney American, [place] 01/30/23 1139

## 2024-10-06 ENCOUNTER — Ambulatory Visit (INDEPENDENT_AMBULATORY_CARE_PROVIDER_SITE_OTHER): Payer: Self-pay | Admitting: Otolaryngology

## 2024-10-06 ENCOUNTER — Encounter (INDEPENDENT_AMBULATORY_CARE_PROVIDER_SITE_OTHER): Payer: Self-pay | Admitting: Otolaryngology

## 2024-10-06 VITALS — BP 116/76 | HR 64 | Ht 71.0 in | Wt 250.0 lb

## 2024-10-06 DIAGNOSIS — G4733 Obstructive sleep apnea (adult) (pediatric): Secondary | ICD-10-CM | POA: Diagnosis not present

## 2024-10-06 DIAGNOSIS — J343 Hypertrophy of nasal turbinates: Secondary | ICD-10-CM

## 2024-10-06 DIAGNOSIS — R0981 Nasal congestion: Secondary | ICD-10-CM

## 2024-10-06 DIAGNOSIS — J342 Deviated nasal septum: Secondary | ICD-10-CM | POA: Diagnosis not present

## 2024-10-06 DIAGNOSIS — Z789 Other specified health status: Secondary | ICD-10-CM

## 2024-10-06 NOTE — Progress Notes (Unsigned)
 Dear Dr. Franne, Here is my assessment for our mutual patient, Curtis Allen. Thank you for allowing me the opportunity to care for your patient. Please do not hesitate to contact me should you have any other questions. Sincerely, Dr. Eldora Blanch  Otolaryngology Clinic Note Referring provider: Dr. Franne HPI:  Curtis Allen is a 38 y.o. male kindly referred by Dr. Franne for evaluation of OSA  Initial visit (09/2024): Diagnosed with sleep apnea two years ago. Placed on CPAP, tried several different masks and settings but unable to tolerate it --- uncomfortable, loses seal and takes it off as he moves at night Insomnia: yes, mild -- staying asleep is a problem, but no problem falling asleep. Able to go back to sleep without issue Chest surgery: no Denies dysphonia, dysphagia Workup so far: Sleep study  He also has fairly significant nasal congestion, which alternates sides. +Trauma to nose hx. Has tried flonase and saline spray without benefit. L>R. No frequent sinus infections.   ENT Surgery: no Personal or FHx of bleeding dz or anesthesia difficulty: no  GLP-1: no AP/AC: no  Tobacco: former, quit.  PMHx: HTN, GAD/MDD, OSA on CPAP, HLD  Independent Review of Additional Tests or Records:  Dr. Auston (04/28/2024): OSA on CPAP but not sleeping well; Dx: OSA, Rx: ref to ENT for inspire consideration CBC and CMP 04/28/2024: WBC 5.7, Hgb 15.3; BUN/Cr 13/0.9 Dr. Franne Referral notes reviewed and uploaded or available in chart in media tab (10/01/2024): AHI 27.7, BMI 30-35, ref for inspire consideration - noted h/o OSA, no RLS; CPAP intolerant due to mask discomfort. Ref to ENT Labs Ferritin 08/05/2024: 137 Sleep study 08/30/2024 independently reviewed: AHI 27.7, Min O2 81%; central events: 1, Mixed 0 PMH/Meds/All/SocHx/FamHx/ROS:   Past Medical History:  Diagnosis Date   Anxiety    Hypertension      History reviewed. No pertinent surgical history.  History reviewed. No pertinent  family history.   Social Connections: Not on file      Current Outpatient Medications:    albuterol  (VENTOLIN  HFA) 108 (90 Base) MCG/ACT inhaler, Inhale 2 puffs into the lungs every 4 (four) hours as needed for wheezing or shortness of breath., Disp: 18 g, Rfl: 0   amoxicillin  (AMOXIL ) 875 MG tablet, Take 1 tablet (875 mg total) by mouth 2 (two) times daily. X 7 days, Disp: 14 tablet, Rfl: 0   busPIRone (BUSPAR) 10 MG tablet, Take 10 mg by mouth 3 (three) times daily., Disp: , Rfl:    famotidine (PEPCID) 40 MG tablet, Take by mouth., Disp: , Rfl:    HYDROcodone -acetaminophen  (NORCO) 5-325 MG tablet, Take 1 tablet by mouth every 6 (six) hours as needed for moderate pain., Disp: 15 tablet, Rfl: 0   ibuprofen  (ADVIL ,MOTRIN ) 600 MG tablet, Take 1 tablet (600 mg total) by mouth every 6 (six) hours as needed for moderate pain., Disp: 30 tablet, Rfl: 0   losartan (COZAAR) 100 MG tablet, Take 100 mg by mouth daily., Disp: , Rfl:    omeprazole (PRILOSEC) 10 MG capsule, Take by mouth., Disp: , Rfl:    oxyCODONE -acetaminophen  (PERCOCET) 7.5-325 MG tablet, Take 1 tablet by mouth every 4 (four) hours as needed for severe pain., Disp: 20 tablet, Rfl: 0   predniSONE  (DELTASONE ) 20 MG tablet, Take 2 tablets (40 mg total) by mouth daily with breakfast., Disp: 10 tablet, Rfl: 0   promethazine -dextromethorphan (PROMETHAZINE -DM) 6.25-15 MG/5ML syrup, Take 5 mLs by mouth 4 (four) times daily as needed., Disp: 100 mL, Rfl: 0   propranolol  ER (INDERAL LA) 60 MG 24 hr capsule, Take 60 mg by mouth daily., Disp: , Rfl:    sulfamethoxazole -trimethoprim  (BACTRIM  DS,SEPTRA  DS) 800-160 MG tablet, Take 1 tablet by mouth 2 (two) times daily., Disp: 20 tablet, Rfl: 0   topiramate (TOPAMAX) 50 MG tablet, Take 50 mg by mouth at bedtime., Disp: , Rfl:    Physical Exam:   BP 116/76 (BP Location: Right Arm, Patient Position: Sitting, Cuff Size: Large)   Pulse 64   Ht 5' 11 (1.803 m)   Wt 250 lb (113.4 kg)   SpO2 96%   BMI  34.87 kg/m   Salient findings:  CN II-XII intact Bilateral EAC clear and TM intact with well pneumatized middle ear spaces Anterior rhinoscopy: Septum deviates left; bilateral inferior turbinates with modest hypertrophy No lesions of oral cavity/oropharynx; tonsils 1/1, Tongue Friedman 3 No obviously palpable neck masses/lymphadenopathy/thyromegaly No respiratory distress or stridor  Seprately Identifiable Procedures:  Prior to initiating any procedures, risks/benefits/alternatives were explained to the patient and verbal consent obtained. Procedure Note Pre-procedure diagnosis:  Obstructive sleep apnea, rule out structural cause Post-procedure diagnosis: Same Procedure: Transnasal Fiberoptic Laryngoscopy, CPT 31575 - Mod 25 Indication: see above Complications: None apparent EBL: 0 mL  The procedure was undertaken to further evaluate the patient's complaint above, with mirror exam inadequate for appropriate examination due to gag reflex and poor patient tolerance  Procedure:  Patient was identified as correct patient. Verbal consent was obtained. The nose was sprayed with oxymetazoline and 4% lidocaine . The The flexible laryngoscope was passed through the nose to view the nasal cavity, pharynx (oropharynx, hypopharynx) and larynx.  The larynx was examined at rest and during multiple phonatory tasks. Documentation was obtained and reviewed with patient. The scope was removed. The patient tolerated the procedure well.  Findings: The nasal cavity and nasopharynx did not reveal any masses or lesions, mucosa appeared to be without obvious lesions. The tongue base, pharyngeal walls, piriform sinuses, vallecula, epiglottis and postcricoid region are normal in appearance; Muller maneuver negative. The visualized portion of the subglottis and proximal trachea is widely patent. The vocal folds are mobile bilaterally. There are no lesions on the free edge of the vocal folds nor elsewhere in the larynx  worrisome for malignancy.    Electronically signed by: Eldora KATHEE Blanch, MD 10/07/2024 9:18 PM   Impression & Plans:  Curtis Allen is a 38 y.o. male with:  1. OSA (obstructive sleep apnea)   2. Intolerance of continuous positive airway pressure (CPAP) ventilation   3. Hypertrophy of both inferior nasal turbinates   4. Nasal congestion   5. Nasal septal deviation    We discussed options including continued CPAP v/s Inspire v/s other surgical therapies. He is most interested in CN XII stim  We had a discussion regarding risks of Inspire including lack of benefit, persistent symptoms, pneumothorax, tongue soreness or weakness, Floor of mouth numbness, injury to major vessels, hematoma, implant infection, bleeding, scarring, tethering of neck, persistent symptoms, need for further procedures, and risk of anesthesia among others.   We discussed process for this and he'd like to move forward with DISE  From nasal standpoint, he does have structural cause (septal dev/ITH) but reported correcting generally may not help OSA and does not affect snoring. As such, he would like to hold off on any mgmt for this currently  Will schedule for DISE  See below regarding exact medications prescribed this encounter including dosages and route: No orders of the defined types were placed in  this encounter.     Thank you for allowing me the opportunity to care for your patient. Please do not hesitate to contact me should you have any other questions.  Sincerely, Eldora Blanch, MD Otolaryngologist (ENT), [facility] Phone: [phone] Fax: [phone]  10/07/2024, 9:18 PM   MDM:  Level 4 - 99204 Complexity/Problems addressed: mod - multiple chronic problems Data complexity: mod - independent review of note, lab, test - Morbidity: mod - decision for surgery  - Prescription Drug prescribed or managed: n

## 2024-10-08 ENCOUNTER — Encounter (HOSPITAL_BASED_OUTPATIENT_CLINIC_OR_DEPARTMENT_OTHER): Payer: Self-pay | Admitting: *Deleted

## 2024-10-08 ENCOUNTER — Other Ambulatory Visit: Payer: Self-pay

## 2024-10-14 ENCOUNTER — Encounter (HOSPITAL_BASED_OUTPATIENT_CLINIC_OR_DEPARTMENT_OTHER)
Admission: RE | Admit: 2024-10-14 | Discharge: 2024-10-14 | Disposition: A | Discharge status: Home / Self Care | Source: Ambulatory Visit | Attending: Otolaryngology | Admitting: Otolaryngology

## 2024-10-14 DIAGNOSIS — Z01818 Encounter for other preprocedural examination: Secondary | ICD-10-CM | POA: Insufficient documentation

## 2024-10-20 ENCOUNTER — Encounter (HOSPITAL_BASED_OUTPATIENT_CLINIC_OR_DEPARTMENT_OTHER): Admission: RE | Payer: Self-pay | Source: Home / Self Care

## 2024-10-20 ENCOUNTER — Encounter (HOSPITAL_BASED_OUTPATIENT_CLINIC_OR_DEPARTMENT_OTHER): Payer: Self-pay | Admitting: Anesthesiology

## 2024-10-20 ENCOUNTER — Ambulatory Visit (HOSPITAL_BASED_OUTPATIENT_CLINIC_OR_DEPARTMENT_OTHER): Admission: RE | Admit: 2024-10-20

## 2024-10-20 DIAGNOSIS — I10 Essential (primary) hypertension: Secondary | ICD-10-CM

## 2024-10-20 HISTORY — DX: Hyperlipidemia, unspecified: E78.5

## 2024-10-20 HISTORY — DX: Sleep apnea, unspecified: G47.30

## 2024-10-20 HISTORY — DX: Headache, unspecified: R51.9

## 2024-10-20 HISTORY — DX: Depression, unspecified: F32.A

## 2024-10-20 HISTORY — DX: Gastro-esophageal reflux disease without esophagitis: K21.9

## 2024-10-20 SURGERY — DRUG INDUCED SLEEP ENDOSCOPY
Anesthesia: General

## 2024-12-03 ENCOUNTER — Other Ambulatory Visit: Payer: Self-pay | Admitting: Physical Medicine & Rehabilitation

## 2024-12-03 DIAGNOSIS — G8929 Other chronic pain: Secondary | ICD-10-CM

## 2024-12-09 ENCOUNTER — Ambulatory Visit
Admission: RE | Admit: 2024-12-09 | Discharge: 2024-12-09 | Disposition: A | Source: Ambulatory Visit | Attending: Physical Medicine & Rehabilitation | Admitting: Physical Medicine & Rehabilitation

## 2024-12-09 DIAGNOSIS — G8929 Other chronic pain: Secondary | ICD-10-CM
